# Patient Record
Sex: Male | Born: 1965 | Race: White | Hispanic: No | Marital: Married | Smoking: Never smoker
Health system: Southern US, Community
[De-identification: ages and names within clinical notes are randomized; demographics above are authoritative.]

---

## 2011-01-06 ENCOUNTER — Inpatient Hospital Stay (INDEPENDENT_AMBULATORY_CARE_PROVIDER_SITE_OTHER)
Admission: RE | Admit: 2011-01-06 | Discharge: 2011-01-06 | Disposition: A | Discharge status: Home / Self Care | Payer: BC Managed Care – PPO | Source: Ambulatory Visit | Attending: Family Medicine | Admitting: Family Medicine

## 2011-01-06 ENCOUNTER — Encounter: Payer: Self-pay | Admitting: Family Medicine

## 2011-01-06 DIAGNOSIS — J069 Acute upper respiratory infection, unspecified: Secondary | ICD-10-CM

## 2011-03-04 NOTE — Progress Notes (Signed)
Summary: fever/TM   Vital Signs:  Patient Profile:   45 Years Old Male CC:      fever/cough Height:     68 inches Weight:      187 pounds O2 Sat:      95 % O2 treatment:    Room Air Temp:     98.6 degrees F oral Pulse rate:   83 / minute Resp:     20 per minute BP sitting:   115 / 78  (left arm) Cuff size:   large  Vitals Entered By: Linton Flemings RN (January 06, 2011 2:57 PM)                  Updated Prior Medication List: No Medications Current Allergies (reviewed today): No known allergies History of Present Illness Chief Complaint: fever/cough History of Present Illness: 45 yo M here for URI symptoms since tuesday evening.  Fairly abrupt onset of chills, body aches, fatigue.  + night sweats.  Has been coughing, + fever up to 101.  [facility] and [facility] to go to urgent care because he had some wheezing.  No chest pain.  + posttussive emesis.  REVIEW OF SYSTEMS Constitutional Symptoms       Complains of fever.     Denies chills, night sweats, weight loss, weight gain, and fatigue.  Eyes       Denies change in vision, eye pain, eye discharge, glasses, contact lenses, and eye surgery. Ear/Nose/Throat/Mouth       Denies hearing loss/aids, change in hearing, ear pain, ear discharge, dizziness, frequent runny nose, frequent nose bleeds, sinus problems, sore throat, hoarseness, and tooth pain or bleeding.  Respiratory       Complains of dry cough, wheezing, and shortness of breath.      Denies productive cough, asthma, bronchitis, and emphysema/COPD.  Cardiovascular       Complains of tires easily with exhertion.      Denies murmurs and chest pain.    Gastrointestinal       Complains of stomach pain and nausea/vomiting.      Denies diarrhea, constipation, blood in bowel movements, and indigestion.      Comments: vomiting w/coughing Genitourniary       Denies painful urination, kidney stones, and loss of urinary control. Neurological  Complains of headaches and weakness.      Denies loss of or changes in sensation, numbness, tngling, tremors, paralysis, seizures, and fainting/blackouts. Musculoskeletal       Denies muscle pain, joint pain, joint stiffness, decreased range of motion, redness, swelling, muscle weakness, and gout.  Skin       Denies bruising, unusual mles/lumps or sores, and hair/skin or nail changes.  Psych       Denies mood changes, temper/anger issues, anxiety/stress, speech problems, depression, and sleep problems. Other Comments: symptoms started Tuesday, [facility], instructed to go to [facility], have missed days out of work   Past History:  Family History: Last updated: 01/06/2011 Family History Diabetes 1st degree relative father- cancer-deceased 3  Social History: Last updated: 01/06/2011 smoke-no alcohol- yes drug use- no  Past Medical History: cyst removed from throat -1976  Past Surgical History: Denies surgical history  Family History: Family History Diabetes 1st degree relative father- cancer-deceased 12  Social History: smoke-no alcohol- yes drug use- no Physical Exam General appearance: well developed, well nourished, no acute distress, appears fatigued - coughing Head: normocephalic, atraumatic Eyes: conjunctivae and lids normal Ears: normal,  no lesions or deformities Nasal: mucosa pink, nonedematous, no septal deviation, turbinates normal Oral/Pharynx: tongue normal, posterior pharynx without erythema or exudate Neck: neck supple,  trachea midline, no masses Chest/Lungs: scant wheeze left middle lung field, few rhonchi that clear with cough throughout. no rales, no respiratory distress Heart: regular rate and  rhythm, no murmur Assessment New Problems: UPPER RESPIRATORY INFECTION, ACUTE (ICD-465.9) FAMILY HISTORY DIABETES 1ST DEGREE RELATIVE (ICD-V18.0)  likely flu-like illness, ? bacterial bronchitis  Plan New Orders: New Patient Level III  [99203] Planning Comments:   Azithromycin x 5 days, tessalon during day and tussionex at bedtime as needed for cough (no driving on the latter).  OTC meds discussed and handout provided as well.   The patient and/or caregiver has been counseled thoroughly with regard to medications prescribed including dosage, schedule, interactions, rationale for use, and possible side effects and they verbalize understanding.  Diagnoses and expected course of recovery discussed and will return if not improved as expected or if the condition worsens. Patient and/or caregiver verbalized understanding.   Patient Instructions: 1)  Your symptoms are most likely due to a flu-like illness caused by a virus or bacterial bronchitis. 2)  Antibiotics are helpful typically in three circumstances with these symptoms: fever, symptoms persisting more than 7 days, or improving then worsening again. 3)  Take full course of the antibiotic until gone. 4)  Drink plenty of fluids and stay hydrated.  5)  Cough medicine - tessalon during day, tussionex at bedtime. 6)  See handout for other over the counter medicines that help with other symptoms.  Orders Added: 1)  New Patient Level III [09811]

## 2011-04-08 ENCOUNTER — Emergency Department
Admission: EM | Admit: 2011-04-08 | Discharge: 2011-04-08 | Disposition: A | Discharge status: Home / Self Care | Payer: BC Managed Care – PPO | Source: Home / Self Care | Attending: Emergency Medicine | Admitting: Emergency Medicine

## 2011-04-08 ENCOUNTER — Encounter: Payer: Self-pay | Admitting: *Deleted

## 2011-04-08 DIAGNOSIS — J329 Chronic sinusitis, unspecified: Secondary | ICD-10-CM

## 2011-04-08 MED ORDER — AZITHROMYCIN 250 MG PO TABS: ORAL_TABLET | ORAL | Status: AC

## 2011-04-08 NOTE — ED Provider Notes (Signed)
History     CSN: [id number]  Arrival date & time 04/08/11  1224   First MD Initiated Contact with Patient 04/08/11 1245      Chief Complaint  Patient presents with  . Nasal Congestion    (Consider location/radiation/quality/duration/timing/severity/associated sxs/prior treatment) HPI Leslie Huynh is a 46 y.o. male who complains of onset of cold symptoms for a few days.  Prior to that he had some flulike symptoms, however is improved and now he is having some worsening facial and sinus pressure. His current symptoms are as follows: No sore throat + cough No pleuritic pain No wheezing +nasal congestion + post-nasal drainage +sinus pain/pressure No chest congestion No itchy/red eyes No earache No hemoptysis No SOB No chills/sweats No fever No nausea No vomiting No abdominal pain No diarrhea No skin rashes No fatigue No myalgias No headache    History reviewed. No pertinent past medical history.  History reviewed. No pertinent past surgical history.  Family History  Problem Relation Age of Onset  . Diabetes Mother   . Cancer Father     bone    History  Substance Use Topics  . Smoking status: Never Smoker   . Smokeless tobacco: Not on file  . Alcohol Use: Yes      Review of Systems  Allergies  Review of patient's allergies indicates no known allergies.  Home Medications   Current Outpatient Rx  Name Route Sig Dispense Refill  . AZITHROMYCIN 250 MG PO TABS  Use as directed 1 each 0    BP 122/80  Pulse 75  Temp(Src) 98.5 F (36.9 C) (Oral)  Resp 14  Ht 5\' 8"  (1.727 m)  Wt 187 lb (84.823 kg)  BMI 28.43 kg/m2  SpO2 100%  Physical Exam  Nursing note and vitals reviewed. Constitutional: He is oriented to person, place, and time. He appears well-developed and well-nourished.  HENT:  Head: Normocephalic and atraumatic.  Right Ear: Tympanic membrane, external ear and ear canal normal.  Left Ear: Tympanic membrane, external ear and ear canal normal.    Nose: Mucosal edema and rhinorrhea present.  Mouth/Throat: Posterior oropharyngeal erythema present. No oropharyngeal exudate or posterior oropharyngeal edema.  Eyes: No scleral icterus.  Neck: Neck supple.  Cardiovascular: Regular rhythm and normal heart sounds.   Pulmonary/Chest: Effort normal and breath sounds normal. No respiratory distress.  Neurological: He is alert and oriented to person, place, and time.  Skin: Skin is warm and dry.  Psychiatric: He has a normal mood and affect. His speech is normal.    ED Course  Procedures (including critical care time)  Labs Reviewed - No data to display No results found.   1. Sinusitis       MDM  1)  Take the prescribed antibiotic as instructed. 2)  Use nasal saline solution (over the counter) at least 3 times a day. 3)  Use over the counter decongestants like Zyrtec-D every 12 hours as needed to help with congestion.  If you have hypertension, do not take medicines with sudafed.  4)  Can take tylenol every 6 hours or motrin every 8 hours for pain or fever. 5)  Follow up with your primary doctor if no improvement in 5-7 days, sooner if increasing pain, fever, or new symptoms.     Lily Kocher, MD 04/08/11 1246

## 2011-04-08 NOTE — ED Notes (Signed)
Patient c/o congestion, cough and sore throat. Patient had a flu shot 3 months ago.

## 2013-08-08 ENCOUNTER — Encounter: Payer: Self-pay | Admitting: Emergency Medicine

## 2013-08-08 ENCOUNTER — Emergency Department
Admission: EM | Admit: 2013-08-08 | Discharge: 2013-08-08 | Disposition: A | Discharge status: Home / Self Care | Payer: BC Managed Care – PPO | Source: Home / Self Care | Attending: Family Medicine | Admitting: Family Medicine

## 2013-08-08 DIAGNOSIS — B029 Zoster without complications: Secondary | ICD-10-CM

## 2013-08-08 MED ORDER — VALACYCLOVIR HCL 1 G PO TABS: 1000.0000 mg | ORAL_TABLET | Freq: Three times a day (TID) | ORAL | Status: DC

## 2013-08-08 MED ORDER — DOXYCYCLINE MONOHYDRATE 100 MG PO CAPS: 100.0000 mg | ORAL_CAPSULE | Freq: Two times a day (BID) | ORAL | Status: DC

## 2013-08-08 NOTE — Discharge Instructions (Signed)
Shingles Shingles (herpes zoster) is an infection that is caused by the same virus that causes chickenpox (varicella). The infection causes a painful skin rash and fluid-filled blisters, which eventually break open, crust over, and heal. It may occur in any area of the body, but it usually affects only one side of the body or face. The pain of shingles usually lasts about 1 month. However, some people with shingles may develop long-term (chronic) pain in the affected area of the body. Shingles often occurs many years after the person had chickenpox. It is more common:  In people older than 50 years.  In people with weakened immune systems, such as those with HIV, AIDS, or cancer.  In people taking medicines that weaken the immune system, such as transplant medicines.  In people under great stress. CAUSES  Shingles is caused by the varicella zoster virus (VZV), which also causes chickenpox. After a person is infected with the virus, it can remain in the person's body for years in an inactive state (dormant). To cause shingles, the virus reactivates and breaks out as an infection in a nerve root. The virus can be spread from person to person (contagious) through contact with open blisters of the shingles rash. It will only spread to people who have not had chickenpox. When these people are exposed to the virus, they may develop chickenpox. They will not develop shingles. Once the blisters scab over, the person is no longer contagious and cannot spread the virus to others. SYMPTOMS  Shingles shows up in stages. The initial symptoms may be pain, itching, and tingling in an area of the skin. This pain is usually described as burning, stabbing, or throbbing.In a few days or weeks, a painful red rash will appear in the area where the pain, itching, and tingling were felt. The rash is usually on one side of the body in a band or belt-like pattern. Then, the rash usually turns into fluid-filled blisters. They  will scab over and dry up in approximately 2 3 weeks. Flu-like symptoms may also occur with the initial symptoms, the rash, or the blisters. These may include:  Fever.  Chills.  Headache.  Upset stomach. DIAGNOSIS  Your caregiver will perform a skin exam to diagnose shingles. Skin scrapings or fluid samples may also be taken from the blisters. This sample will be examined under a microscope or sent to a lab for further testing. TREATMENT  There is no specific cure for shingles. Your caregiver will likely prescribe medicines to help you manage the pain, recover faster, and avoid long-term problems. This may include antiviral drugs, anti-inflammatory drugs, and pain medicines. HOME CARE INSTRUCTIONS   Take a cool bath or apply cool compresses to the area of the rash or blisters as directed. This may help with the pain and itching.   Only take over-the-counter or prescription medicines as directed by your caregiver.   Rest as directed by your caregiver.  Keep your rash and blisters clean with mild soap and cool water or as directed by your caregiver.  Do not pick your blisters or scratch your rash. Apply an anti-itch cream or numbing creams to the affected area as directed by your caregiver.  Keep your shingles rash covered with a loose bandage (dressing).  Avoid skin contact with:  Babies.   Pregnant women.   Children with eczema.   Elderly people with transplants.   People with chronic illnesses, such as leukemia or AIDS.   Wear loose-fitting clothing to help ease   the pain of material rubbing against the rash.  Keep all follow-up appointments with your caregiver.If the area involved is on your face, you may receive a referral for follow-up to a specialist, such as an eye doctor (ophthalmologist) or an ear, nose, and throat (ENT) doctor. Keeping all follow-up appointments will help you avoid eye complications, chronic pain, or disability.  SEEK IMMEDIATE MEDICAL  CARE IF:   You have facial pain, pain around the eye area, or loss of feeling on one side of your face.  You have ear pain or ringing in your ear.  You have loss of taste.  Your pain is not relieved with prescribed medicines.   Your redness or swelling spreads.   You have more pain and swelling.  Your condition is worsening or has changed.   You have a feveror persistent symptoms for more than 2 3 days.  You have a fever and your symptoms suddenly get worse. MAKE SURE YOU:  Understand these instructions.  Will watch your condition.  Will get help right away if you are not doing well or get worse. Document Released: 03/18/2005 Document Revised: 12/11/2011 Document Reviewed: 10/31/2011 [employer] Patient Information 2014 [employer].  

## 2013-08-08 NOTE — ED Notes (Signed)
Leslie Huynh noticed 2 bumps on his head 3 days ago. The area became red 1 day ago. Denies itching, pain, fever, chills or sweats.

## 2013-08-08 NOTE — ED Provider Notes (Signed)
CSN: [id number]     Arrival date & time 08/08/13  1708 History   First MD Initiated Contact with Patient 08/08/13 1725     Chief Complaint  Patient presents with  . Skin Problem      HPI Comments: Patient noticed 2 small bumps on his left forehead/scalp 3 days ago.  Yesterday the area became red.  The is no pain but the area "tingles."  He recalls no injury or insect bite.  No fevers, chills, and sweats.   He feels well otherwise.  Patient is a 48 y.o. male presenting with rash. The history is provided by the patient.  Rash Pain location: left scalp. Pain quality comment:  Tingling Pain radiates to:  Does not radiate Pain severity:  Mild Onset quality:  Gradual Duration:  3 days Timing:  Constant Progression:  Worsening Chronicity:  New Relieved by:  Nothing Worsened by:  Nothing tried Associated symptoms: no anorexia, no chills, no cough, no fatigue, no fever, no nausea and no sore throat     History reviewed. No pertinent past medical history. History reviewed. No pertinent past surgical history. Family History  Problem Relation Age of Onset  . Diabetes Mother   . Cancer Father     bone  . Cancer Other     Leukemia   History  Substance Use Topics  . Smoking status: Never Smoker   . Smokeless tobacco: Never Used  . Alcohol Use: Yes    Review of Systems  Constitutional: Negative for fever, chills and fatigue.  HENT: Negative for sore throat.   Respiratory: Negative for cough.   Gastrointestinal: Negative for nausea and anorexia.  Skin: Positive for rash.  All other systems reviewed and are negative.   Allergies  Review of patient's allergies indicates no known allergies.  Home Medications   Prior to Admission medications   Not on File   BP 127/78  Pulse 67  Temp(Src) 98.2 F (36.8 C) (Oral)  Resp 16  Ht 5\' 8"  (1.727 m)  Wt 193 lb (87.544 kg)  BMI 29.35 kg/m2  SpO2 96% Physical Exam  Nursing note and vitals reviewed. Constitutional: He is oriented  to person, place, and time. He appears well-developed and well-nourished. No distress.  HENT:  Head: Normocephalic.    Right Ear: External ear normal.  Left Ear: External ear normal.  Nose: Nose normal.  Mouth/Throat: Oropharynx is clear and moist.  Left scalp and forehead have an area of macular erythema  as noted on diagram.  There are several small 2 to 3mm excoriations.  No tenderness, swelling, or induration.   There is a tender post-auricular node  as noted on diagram.    Eyes: Conjunctivae and EOM are normal. Pupils are equal, round, and reactive to light.  Neck: Neck supple.  Cardiovascular: Normal heart sounds.   Pulmonary/Chest: Breath sounds normal.  Abdominal: There is no tenderness.  Lymphadenopathy:    He has no cervical adenopathy.  Neurological: He is alert and oriented to person, place, and time.  Skin: Skin is warm and dry.    ED Course  Procedures  none      MDM   1. Herpes zoster    Begin Valtrex. Will empirically begin doxycycline to cover possibility of cellulitis. Followup with dermatologist if not improving 3 to 4 days.    Lattie HawStephen A Murice Barbar, MD 08/08/13 [phone]

## 2014-02-28 ENCOUNTER — Emergency Department
Admission: EM | Admit: 2014-02-28 | Discharge: 2014-02-28 | Disposition: A | Discharge status: Home / Self Care | Payer: BC Managed Care – PPO | Source: Home / Self Care | Attending: Family Medicine | Admitting: Family Medicine

## 2014-02-28 ENCOUNTER — Encounter: Payer: Self-pay | Admitting: Emergency Medicine

## 2014-02-28 ENCOUNTER — Encounter (HOSPITAL_BASED_OUTPATIENT_CLINIC_OR_DEPARTMENT_OTHER): Payer: Self-pay | Admitting: *Deleted

## 2014-02-28 ENCOUNTER — Emergency Department (HOSPITAL_BASED_OUTPATIENT_CLINIC_OR_DEPARTMENT_OTHER): Payer: BC Managed Care – PPO

## 2014-02-28 ENCOUNTER — Emergency Department (HOSPITAL_BASED_OUTPATIENT_CLINIC_OR_DEPARTMENT_OTHER)
Admission: EM | Admit: 2014-02-28 | Discharge: 2014-02-28 | Disposition: A | Discharge status: Home / Self Care | Payer: BC Managed Care – PPO | Attending: Emergency Medicine | Admitting: Emergency Medicine

## 2014-02-28 DIAGNOSIS — R079 Chest pain, unspecified: Secondary | ICD-10-CM | POA: Insufficient documentation

## 2014-02-28 DIAGNOSIS — R002 Palpitations: Secondary | ICD-10-CM | POA: Diagnosis not present

## 2014-02-28 DIAGNOSIS — R419 Unspecified symptoms and signs involving cognitive functions and awareness: Secondary | ICD-10-CM | POA: Insufficient documentation

## 2014-02-28 DIAGNOSIS — Z792 Long term (current) use of antibiotics: Secondary | ICD-10-CM | POA: Diagnosis not present

## 2014-02-28 DIAGNOSIS — M940 Chondrocostal junction syndrome [Tietze]: Secondary | ICD-10-CM

## 2014-02-28 LAB — COMPREHENSIVE METABOLIC PANEL
ALT: 55 U/L — AB (ref 0–53)
AST: 41 U/L — AB (ref 0–37)
Albumin: 4.5 g/dL (ref 3.5–5.2)
Alkaline Phosphatase: 100 U/L (ref 39–117)
Anion gap: 15 (ref 5–15)
BUN: 14 mg/dL (ref 6–23)
CALCIUM: 10.2 mg/dL (ref 8.4–10.5)
CHLORIDE: 101 meq/L (ref 96–112)
CO2: 27 meq/L (ref 19–32)
Creatinine, Ser: 1.3 mg/dL (ref 0.50–1.35)
GFR calc Af Amer: 73 mL/min — ABNORMAL LOW (ref >90)
GFR, EST NON AFRICAN AMERICAN: 63 mL/min — AB (ref >90)
Glucose, Bld: 96 mg/dL (ref 70–99)
Potassium: 4 mEq/L (ref 3.7–5.3)
SODIUM: 143 meq/L (ref 137–147)
Total Bilirubin: 0.3 mg/dL (ref 0.3–1.2)
Total Protein: 7.6 g/dL (ref 6.0–8.3)

## 2014-02-28 LAB — CBC WITH DIFFERENTIAL/PLATELET
BASOS ABS: 0 10*3/uL (ref 0.0–0.1)
Basophils Relative: 0 % (ref 0–1)
EOS PCT: 1 % (ref 0–5)
Eosinophils Absolute: 0.1 10*3/uL (ref 0.0–0.7)
HCT: 41.9 % (ref 39.0–52.0)
Hemoglobin: 14.6 g/dL (ref 13.0–17.0)
LYMPHS PCT: 20 % (ref 12–46)
Lymphs Abs: 2.1 10*3/uL (ref 0.7–4.0)
MCH: 32.2 pg (ref 26.0–34.0)
MCHC: 34.8 g/dL (ref 30.0–36.0)
MCV: 92.5 fL (ref 78.0–100.0)
Monocytes Absolute: 1 10*3/uL (ref 0.1–1.0)
Monocytes Relative: 10 % (ref 3–12)
NEUTROS ABS: 7.3 10*3/uL (ref 1.7–7.7)
NEUTROS PCT: 69 % (ref 43–77)
PLATELETS: 196 10*3/uL (ref 150–400)
RBC: 4.53 MIL/uL (ref 4.22–5.81)
RDW: 12.6 % (ref 11.5–15.5)
WBC: 10.5 10*3/uL (ref 4.0–10.5)

## 2014-02-28 LAB — CK TOTAL AND CKMB (NOT AT ARMC)
CK TOTAL: 383 U/L — AB (ref 7–232)
CK, MB: 5.5 ng/mL — AB (ref 0.0–5.0)
RELATIVE INDEX: 1.4 (ref 0.0–4.0)

## 2014-02-28 LAB — TROPONIN I: Troponin I: <0.3 ng/mL (ref <0.30)

## 2014-02-28 MED ORDER — MELOXICAM 15 MG PO TABS: 15.0000 mg | ORAL_TABLET | Freq: Every day | ORAL | Status: DC

## 2014-02-28 MED ORDER — ASPIRIN 325 MG PO TABS
325.0000 mg | ORAL_TABLET | Freq: Once | ORAL | Status: AC
  Administered 2014-02-28: 325 mg via ORAL
  Filled 2014-02-28: qty 1

## 2014-02-28 NOTE — Discharge Instructions (Signed)

## 2014-02-28 NOTE — ED Notes (Signed)
Chest pain started Saturday and Sunday intermittently, today constant dull pain 3/10, no history, denies nausea, pain down arm, also ran 4k this morning

## 2014-02-28 NOTE — ED Provider Notes (Signed)
CSN: [id number]     Arrival date & time 02/28/14  1639 History  This chart was scribed for Leslie Huynh [name], MD by Gwenyth Oberatherine Macek, ED Scribe. This patient was seen in room MH10/MH10 and the patient's care was started at 5:01 PM.    Chief Complaint  Patient presents with  . Chest Pain   The history is provided by the patient. No language interpreter was used.    HPI Comments: Leslie Huynh is a 48 y.o. male who presents to the Emergency Department complaining of intermittent, mild fluttering in his chest that started 2 days ago. Pt [facility] for the same symptoms where he had elevated CK and CKMB levels. He states that his symptoms decreased after then visit, but then returned when he received a call from Dr. Cathren HarshBeese to go to the ER. Pt notes that he ran this morning and that symptoms reoccurred after exercise, however were improved during exercising. 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 He can feel fluttering intermittently with deep breaths and notes no change when lying flat. He has not taken Aspirin today. Pt denies a history of similar symptoms, a history of medical problems and a family history of CAD less than 49 y/o. He also denies nausea, vomiting, dark urine and dysuria as associated symptoms.   History reviewed. No pertinent past medical history. History reviewed. No pertinent past surgical history. Family History  Problem Relation Age of Onset  . Diabetes Mother   . Cancer Father     bone  . Cancer Other     Leukemia   History  Substance Use Topics  . Smoking status: Never Smoker   . Smokeless tobacco: Never Used  . Alcohol Use: Yes    Review of Systems  Cardiovascular: Positive for palpitations.       Fluttering  Gastrointestinal: Negative for nausea and vomiting.  Genitourinary: Negative for dysuria.  All other systems reviewed and are negative.  Allergies  Review of patient's allergies indicates  no known allergies.  Home Medications   Prior to Admission medications   Medication Sig Start Date End Date Taking? Authorizing Provider  meloxicam (MOBIC) 15 MG tablet Take 1 tablet (15 mg total) by mouth daily. Take with food each morning 02/28/14   Lattie HawStephen A Beese, MD  valACYclovir (VALTREX) 1000 MG tablet Take 1 tablet (1,000 mg total) by mouth 3 (three) times daily. 08/08/13   Lattie HawStephen A Beese, MD   BP 142/83 mmHg  Pulse 70  Temp(Src) 97.6 F (36.4 C) (Oral)  Resp 18  SpO2 100% Physical Exam  Constitutional: He is oriented to person, place, and time. He appears well-developed and well-nourished.  HENT:  Head: Normocephalic and atraumatic.  Eyes: Conjunctivae and EOM are normal.  Neck: Normal range of motion. Neck supple.  Cardiovascular: Normal rate, regular rhythm and normal heart sounds.   Pulmonary/Chest: Effort normal and breath sounds normal. No respiratory distress.  Abdominal: He exhibits no distension. There is no tenderness. There is no rebound and no guarding.  Musculoskeletal: Normal range of motion.  Neurological: He is alert and oriented to person, place, and time.  Skin: Skin is warm and dry.  Vitals reviewed.   ED Course  Procedures (including critical care time) DIAGNOSTIC STUDIES: Oxygen Saturation is 100% on RA, normal by my interpretation.    COORDINATION OF CARE: 5:10 PM Discussed treatment plan with pt which includes lab work. Pt agreed to plan.  Labs Review  Labs Reviewed  COMPREHENSIVE METABOLIC PANEL - Abnormal; Notable for the following:    AST 41 (*)    ALT 55 (*)    GFR calc non Af Amer 63 (*)    GFR calc Af Amer 73 (*)    All other components within normal limits  CBC WITH DIFFERENTIAL  TROPONIN I    Imaging Review Dg Chest Portable 1 View  02/28/2014   CLINICAL DATA:  48 year old male with intermittent left-sided chest pain  EXAM: PORTABLE CHEST - 1 VIEW  COMPARISON:  None.  FINDINGS: The lungs are clear and negative for focal  airspace consolidation, pulmonary edema or suspicious pulmonary nodule. No pleural effusion or pneumothorax. Cardiac and mediastinal contours are within normal limits. No acute fracture or lytic or blastic osseous lesions. The visualized upper abdominal bowel gas pattern is unremarkable.  IMPRESSION: Negative chest x-ray.   Electronically Signed   By: Malachy MoanHeath  McCullough M.D.   On: 02/28/2014 17:20     EKG Interpretation None      MDM   Final diagnoses:  Chest pain    48 y.o. male without pertinent PMH presents with chest fluttering beginning 2 days ago. He was seen by urgent care today who obtained a CK and CK-MB which were mildly elevated. The patient's symptoms are extremely atypical for ACS and seem entirely isolated to anxiety. EKG without acute ST or T-wave changes. On arrival the patient's vital signs and physical exam as above. He has had no acute change in symptoms over the last 6 hours. Troponin negative. Unlikely ACS given constellation of symptoms. Elevation in CK likely secondary to exercise.  Discharged home in stable condition with standard return precautions for chest pain. Cardiology referral given.    1. Chest pain           Leslie Huynh Carliyah Cotterman, MD 03/01/14 [phone]

## 2014-02-28 NOTE — ED Provider Notes (Signed)
CSN: [id number]     Arrival date & time 02/28/14  1143 History   None    Chief Complaint  Patient presents with  . Chest Pain      HPI Comments: Patient reports that two days ago after lunch he felt a vague sensation in his anterior chest like indigestion and it lasted several hours.  He denies shortness of breath, radiation, nausea, sweating.  Yesterday he noted brief flashes of discomfort in his anterior chest that lasted all day.  He recalls no recent injury or change in activities although he notes that he regularly lifts 40 to 50 pounds at work.  Today he ran 4k, and notes that his chest discomfort has been constant for about 3 hours.  He tried taking an antacid, but he is not sure if it helped. Patient has no past history of cardiac disease and no family history of cardiac disease.  Patient is a 48 y.o. male presenting with chest pain. The history is provided by the patient.  Chest Pain Pain location:  L chest Pain quality: dull   Pain quality: not crushing, no pressure, not radiating, not stabbing and not throbbing   Pain radiates to:  Does not radiate Pain radiates to the back: no   Pain severity:  Mild Onset quality:  Gradual Duration:  2 days Timing:  Intermittent Progression:  Waxing and waning Chronicity:  New Context: stress   Context: not breathing, not eating, not lifting, no movement, not raising an arm, not at rest and no trauma   Relieved by:  Nothing Worsened by:  Nothing tried Ineffective treatments:  Antacids Associated symptoms: anxiety and fatigue   Associated symptoms: no abdominal pain, no AICD problem, no back pain, no cough, no diaphoresis, no dizziness, no dysphagia, no fever, no headache, no heartburn, no lower extremity edema, no nausea, no near-syncope, no numbness, no palpitations, no shortness of breath and not vomiting   Risk factors: male sex     History reviewed. No pertinent past medical history. History reviewed. No pertinent past surgical  history. Family History  Problem Relation Age of Onset  . Diabetes Mother   . Cancer Father     bone  . Cancer Other     Leukemia   History  Substance Use Topics  . Smoking status: Never Smoker   . Smokeless tobacco: Never Used  . Alcohol Use: Yes    Review of Systems  Constitutional: Positive for fatigue. Negative for fever and diaphoresis.  HENT: Negative for trouble swallowing.   Respiratory: Negative for cough and shortness of breath.   Cardiovascular: Positive for chest pain. Negative for palpitations and near-syncope.  Gastrointestinal: Negative for heartburn, nausea, vomiting and abdominal pain.  Musculoskeletal: Negative for back pain.  Neurological: Negative for dizziness, numbness and headaches.    Allergies  Review of patient's allergies indicates not on file.  Home Medications   Prior to Admission medications   Medication Sig Start Date End Date Taking? Authorizing Provider  meloxicam (MOBIC) 15 MG tablet Take 1 tablet (15 mg total) by mouth daily. Take with food each morning 02/28/14   Lattie HawStephen A Beese, MD  valACYclovir (VALTREX) 1000 MG tablet Take 1 tablet (1,000 mg total) by mouth 3 (three) times daily. 08/08/13   Lattie HawStephen A Beese, MD   BP 126/85 mmHg  Pulse 60  Temp(Src) 97.7 F (36.5 C) (Oral)  Ht 5\' 8"  (1.727 m)  Wt 192 lb (87.091 kg)  BMI 29.20 kg/m2  SpO2 97% Physical Exam  Constitutional: He is oriented to person, place, and time. He appears well-developed and well-nourished. No distress.  HENT:  Head: Normocephalic.  Mouth/Throat: Oropharynx is clear and moist.  Eyes: Conjunctivae are normal. Pupils are equal, round, and reactive to light.  Neck: Normal range of motion. Neck supple.  Cardiovascular: Normal rate, regular rhythm, normal heart sounds and intact distal pulses.   Pulmonary/Chest: Effort normal and breath sounds normal. No respiratory distress. He has no wheezes. He has no rales. He exhibits tenderness.    Left anterior chest has  distinct tenderness beginning at sternum and extending laterally as noted on diagram.  Palpation there recreates his discomfort.  Abdominal: Soft. There is no tenderness.  Musculoskeletal: He exhibits no edema.  Lymphadenopathy:    He has no cervical adenopathy.  Neurological: He is alert and oriented to person, place, and time.  Skin: Skin is warm and dry. No rash noted. He is not diaphoretic.  Nursing note and vitals reviewed.   ED Course  Procedures  None    Labs Reviewed  CK TOTAL AND CKMB   EKG: Rate:  62 BPM PR:  198 msec QT:  392 msec QTcH:  395 msec QRSD:  118 msec QRS axis:  -26 degrees Interpretation:   Normal sinus rhythm.  Non specific changes.  No acute ST or T-wave changes    MDM   1. Costochondritis    Risk stratification with stat CKMB pending. Rx for Mobic Apply ice pack for 20 to 30 minutes, 3 to 4 times daily  Continue until pain decreases.  Avoid heavy lifting. If symptoms become significantly worse during the night or over the weekend, proceed to the local emergency room.  Followup with Family Doctor in about 4 days.   Later Addendum: Total CK elevated 383 U/L (normal 7 - 232) CK, MB mildly elevated 5.5 ng/mL (normal 0.0 - 5.0) Discussed with patient; advised to proceed to ER for further evaluation.    Lattie HawStephen A Beese, MD 03/03/14 [phone]

## 2014-02-28 NOTE — ED Notes (Signed)
[facility].  Continued to have it today, pt was weak and not feeling right this morning after running.  [facility]

## 2014-02-28 NOTE — Discharge Instructions (Signed)
Apply ice pack for 20 to 30 minutes, 3 to 4 times daily  Continue until pain decreases.  Avoid heavy lifting. If symptoms become significantly worse during the night or over the weekend, proceed to the local emergency room.    Costochondritis Costochondritis, sometimes called Tietze syndrome, is a swelling and irritation (inflammation) of the tissue (cartilage) that connects your ribs with your breastbone (sternum). It causes pain in the chest and rib area. Costochondritis usually goes away on its own over time. It can take up to 6 weeks or longer to get better, especially if you are unable to limit your activities. CAUSES  Some cases of costochondritis have no known cause. Possible causes include:  Injury (trauma).  Exercise or activity such as lifting.  Severe coughing. SIGNS AND SYMPTOMS  Pain and tenderness in the chest and rib area.  Pain that gets worse when coughing or taking deep breaths.  Pain that gets worse with specific movements. DIAGNOSIS  Your health care provider will do a physical exam and ask about your symptoms. Chest X-rays or other tests may be done to rule out other problems. TREATMENT  Costochondritis usually goes away on its own over time. Your health care provider may prescribe medicine to help relieve pain. HOME CARE INSTRUCTIONS   Avoid exhausting physical activity. Try not to strain your ribs during normal activity. This would include any activities using chest, abdominal, and side muscles, especially if heavy weights are used.  Apply ice to the affected area for the first 2 days after the pain begins.  Put ice in a plastic bag.  Place a towel between your skin and the bag.  Leave the ice on for 20 minutes, 2-3 times a day.  Only take over-the-counter or prescription medicines as directed by your health care provider. SEEK MEDICAL CARE IF:  You have redness or swelling at the rib joints. These are signs of infection.  Your pain does not go away  despite rest or medicine. SEEK IMMEDIATE MEDICAL CARE IF:   Your pain increases or you are very uncomfortable.  You have shortness of breath or difficulty breathing.  You cough up blood.  You have worse chest pains, sweating, or vomiting.  You have a fever or persistent symptoms for more than 2-3 days.  You have a fever and your symptoms suddenly get worse. MAKE SURE YOU:   Understand these instructions.  Will watch your condition.  Will get help right away if you are not doing well or get worse. Document Released: 12/26/2004 Document Revised: 01/06/2013 Document Reviewed: 10/20/2012 [facility] Patient Information 2015 [place]. This information is not intended to replace advice given to you by your health care provider. Make sure you discuss any questions you have with your health care provider.

## 2014-12-12 ENCOUNTER — Emergency Department (INDEPENDENT_AMBULATORY_CARE_PROVIDER_SITE_OTHER): Payer: BC Managed Care – PPO

## 2014-12-12 ENCOUNTER — Emergency Department
Admission: EM | Admit: 2014-12-12 | Discharge: 2014-12-12 | Disposition: A | Discharge status: Home / Self Care | Payer: BC Managed Care – PPO | Source: Home / Self Care | Attending: Family Medicine | Admitting: Family Medicine

## 2014-12-12 ENCOUNTER — Encounter: Payer: Self-pay | Admitting: *Deleted

## 2014-12-12 DIAGNOSIS — J00 Acute nasopharyngitis [common cold]: Secondary | ICD-10-CM | POA: Diagnosis not present

## 2014-12-12 DIAGNOSIS — J209 Acute bronchitis, unspecified: Secondary | ICD-10-CM | POA: Diagnosis not present

## 2014-12-12 MED ORDER — AZITHROMYCIN 250 MG PO TABS: ORAL_TABLET | ORAL | Status: AC

## 2014-12-12 MED ORDER — PREDNISONE 20 MG PO TABS: 20.0000 mg | ORAL_TABLET | Freq: Two times a day (BID) | ORAL | Status: AC

## 2014-12-12 MED ORDER — GUAIFENESIN-CODEINE 100-10 MG/5ML PO SOLN: ORAL | Status: AC

## 2014-12-12 NOTE — ED Notes (Signed)
Pt c/o 1 month of cough, HA, runny nose, sinus drainage. [facility].

## 2014-12-12 NOTE — ED Provider Notes (Signed)
CSN: [id number]     Arrival date & time 12/12/14  1537 History   First MD Initiated Contact with Patient 12/12/14 1609     Chief Complaint  Patient presents with  . URI      HPI Comments: Patient reports that he developed a mild sore throat 3.5 weeks ago that was immediately followed by nasal congestion.   One week later he visited a [facility] where he was prescribed Augmentin.  During the past week he has developed increasing cough, shortness of breath, and fatigue.  He often coughs until he gags.  His ears feel clogged.  He has been taking Allegra without improvement.  He does not remember his last Tdap.  The history is provided by the patient.    History reviewed. No pertinent past medical history. History reviewed. No pertinent past surgical history. Family History  Problem Relation Age of Onset  . Diabetes Mother   . Cancer Father     bone  . Cancer Other     Leukemia   Social History  Substance Use Topics  . Smoking status: Never Smoker   . Smokeless tobacco: Never Used  . Alcohol Use: Yes    Review of Systems + sore throat, resolved + cough No pleuritic pain + wheezing + nasal congestion + post-nasal drainage No sinus pain/pressure No itchy/red eyes ? earache No hemoptysis + SOB with activity No fever/chills No nausea No vomiting No abdominal pain No diarrhea No urinary symptoms No skin rash + fatigue No myalgias + headache Used OTC meds without relief  Allergies  Review of patient's allergies indicates no known allergies.  Home Medications   Prior to Admission medications   Medication Sig Start Date End Date Taking? Authorizing Provider  Fexofenadine HCl (ALLEGRA PO) Take by mouth.   Yes Historical Provider, MD  azithromycin (ZITHROMAX Z-PAK) 250 MG tablet Take 2 tabs today; then begin one tab once daily for 4 more days. 12/12/14   Lattie Haw, MD  guaiFENesin-codeine 100-10 MG/5ML syrup Take 10mL by mouth at bedtime as needed for cough  12/12/14   Lattie Haw, MD  predniSONE (DELTASONE) 20 MG tablet Take 1 tablet (20 mg total) by mouth 2 (two) times daily. Take with food. 12/12/14   Lattie Haw, MD   Meds Ordered and Administered this Visit  Medications - No data to display  BP 117/76 mmHg  Pulse 75  Temp(Src) 99.1 F (37.3 C) (Oral)  Resp 16  Wt 190 lb (86.183 kg)  SpO2 96% No data found.   Physical Exam Nursing notes and Vital Signs reviewed. Appearance:  Patient appears stated age, and in no acute distress Eyes:  Pupils are equal, round, and reactive to light and accomodation.  Extraocular movement is intact.  Conjunctivae are not inflamed  Ears:  Canals normal.  Tympanic membranes normal.  Nose:  Mildly congested turbinates.  No sinus tenderness.    Pharynx:  Normal Neck:  Supple.  No adenopathy Lungs:   Faint bilateral expiratory wheezes.  Breath sounds are equal.  Moving air well. Heart:  Regular rate and rhythm without murmurs, rubs, or gallops.  Abdomen:  Nontender without masses or hepatosplenomegaly.  Bowel sounds are present.  No CVA or flank tenderness.  Extremities:  No edema.  No calf tenderness Skin:  No rash present.   ED Course  Procedures  None   Imaging Review Dg Chest 2 View  12/12/2014   CLINICAL DATA:  Cold for 3-1/2 weeks. Persistent dry cough,  sinus headaches, shortness of breath.  EXAM: CHEST  2 VIEW  COMPARISON:  Chest x-ray dated 02/28/2014.  FINDINGS: Cardiomediastinal silhouette remains normal in size and configuration. Lungs are clear. No evidence of pneumonia. No pleural effusion. No pneumothorax. Mild degenerative change noted within the mid and upper thoracic spine. No acute osseous abnormality.  IMPRESSION: No evidence of acute cardiopulmonary abnormality.  No pneumonia.   Electronically Signed   By: Bary Richard M.D.   On: 12/12/2014 16:43      MDM   1. Acute bronchitis, unspecified organism; ?pertussis    Begin Z-pak for atypical coverage. Prednisone burst. Rx for  Robitussin AC for night time cough.  Take plain guaifenesin (  extended release tabs such as Mucinex) twice daily, with plenty of water, for cough and congestion.  May add Pseudoephedrine ( , one or two every 4 to 6 hours) for sinus congestion.  Get adequate rest.    Stop all antihistamines for now, and other non-prescription cough/cold preparations. Recommend a Tdap when well.  Followup with Family Doctor if not improved in one week.     Lattie Haw, MD 12/18/14 [phone]

## 2014-12-12 NOTE — Discharge Instructions (Signed)
Take plain guaifenesin (  extended release tabs such as Mucinex) twice daily, with plenty of water, for cough and congestion.  May add Pseudoephedrine ( , one or two every 4 to 6 hours) for sinus congestion.  Get adequate rest.    Stop all antihistamines for now, and other non-prescription cough/cold preparations. Recommend a Tdap when well.

## 2015-04-24 IMAGING — CR DG CHEST 1V PORT
1 series · 1 of 1 positions shown · non-contrast
Comparison: None.

CLINICAL DATA: 48-year-old male with intermittent left-sided chest
pain

EXAM:
PORTABLE CHEST - 1 VIEW

[view not recorded]
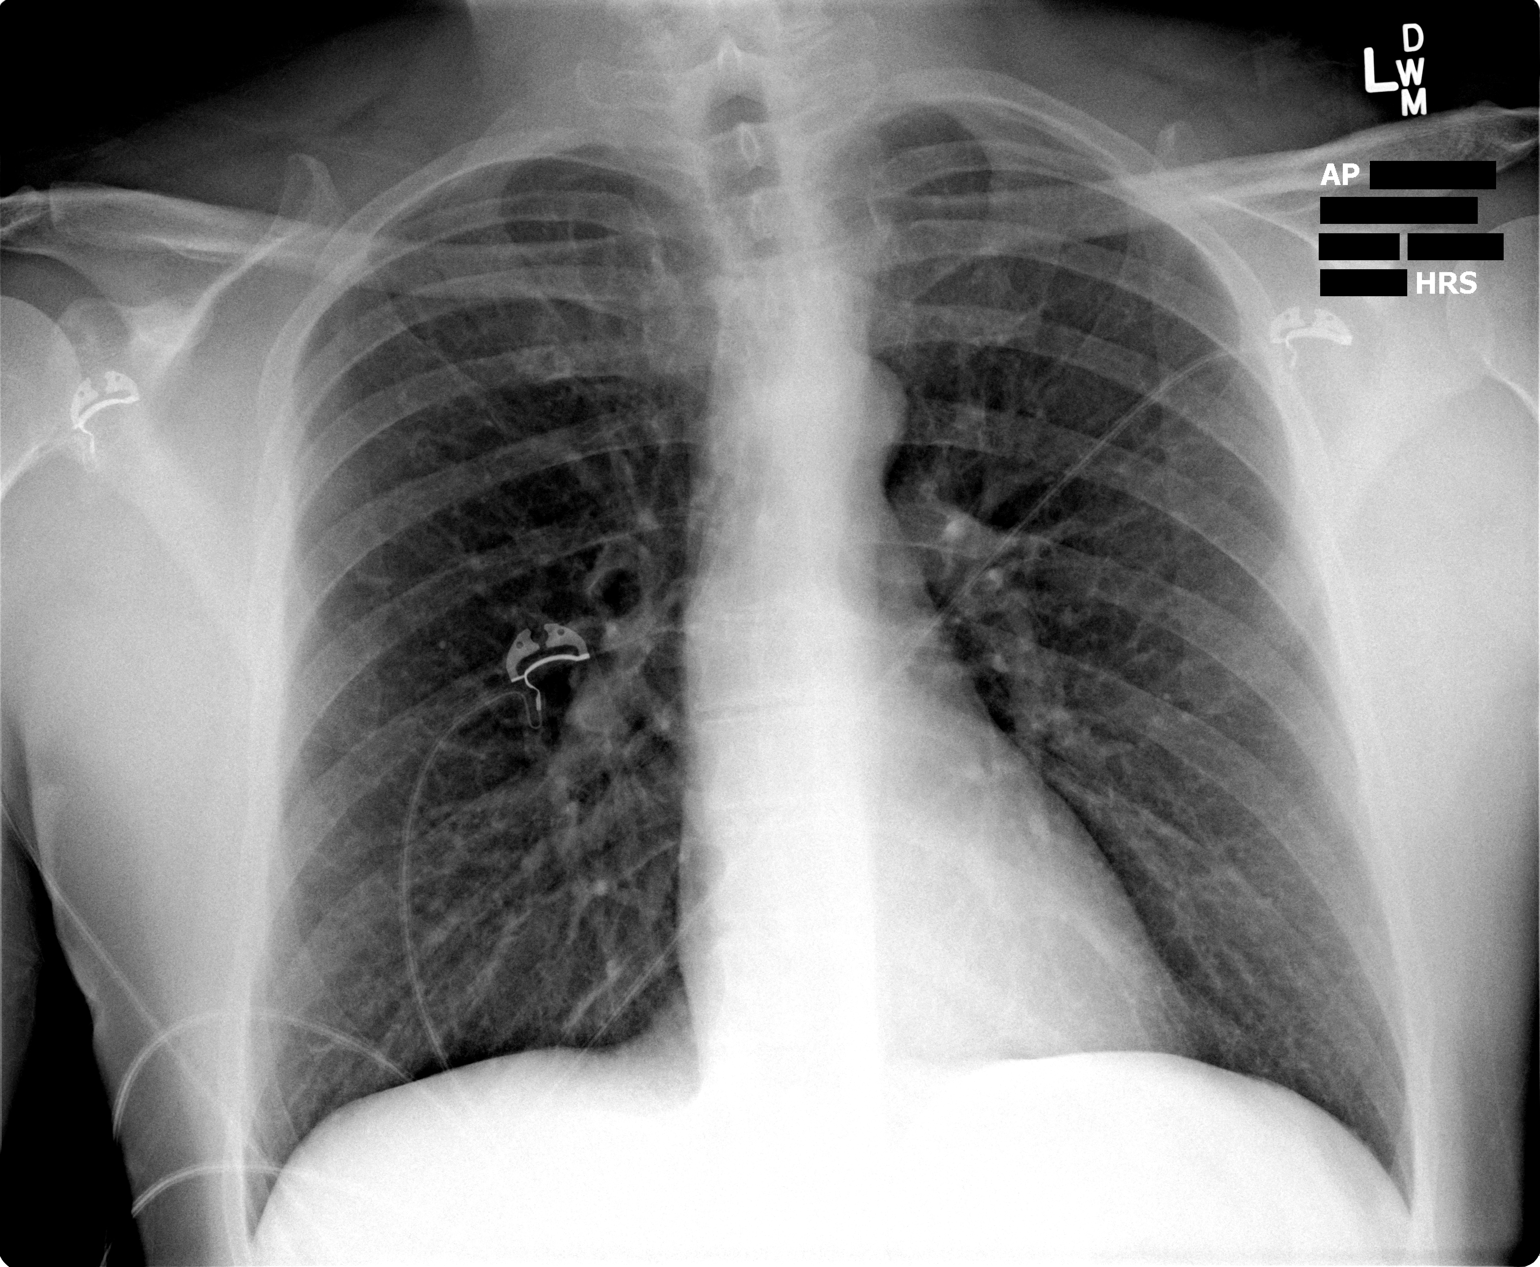

[1 of 1 positions shown; findings below may reference images not displayed]

FINDINGS: The lungs are clear and negative for focal airspace consolidation,
pulmonary edema or suspicious pulmonary nodule. No pleural effusion
or pneumothorax. Cardiac and mediastinal contours are within normal
limits. No acute fracture or lytic or blastic osseous lesions. The
visualized upper abdominal bowel gas pattern is unremarkable.
IMPRESSION: Negative chest x-ray.

## 2016-02-05 IMAGING — CR DG CHEST 2V
2 series · 2 of 2 positions shown · non-contrast
Comparison: Chest x-ray dated 02/28/2014.

CLINICAL DATA: Cold for 3-1/2 weeks. Persistent dry cough, sinus
headaches, shortness of breath.

EXAM:
CHEST  2 VIEW

[chest pa]
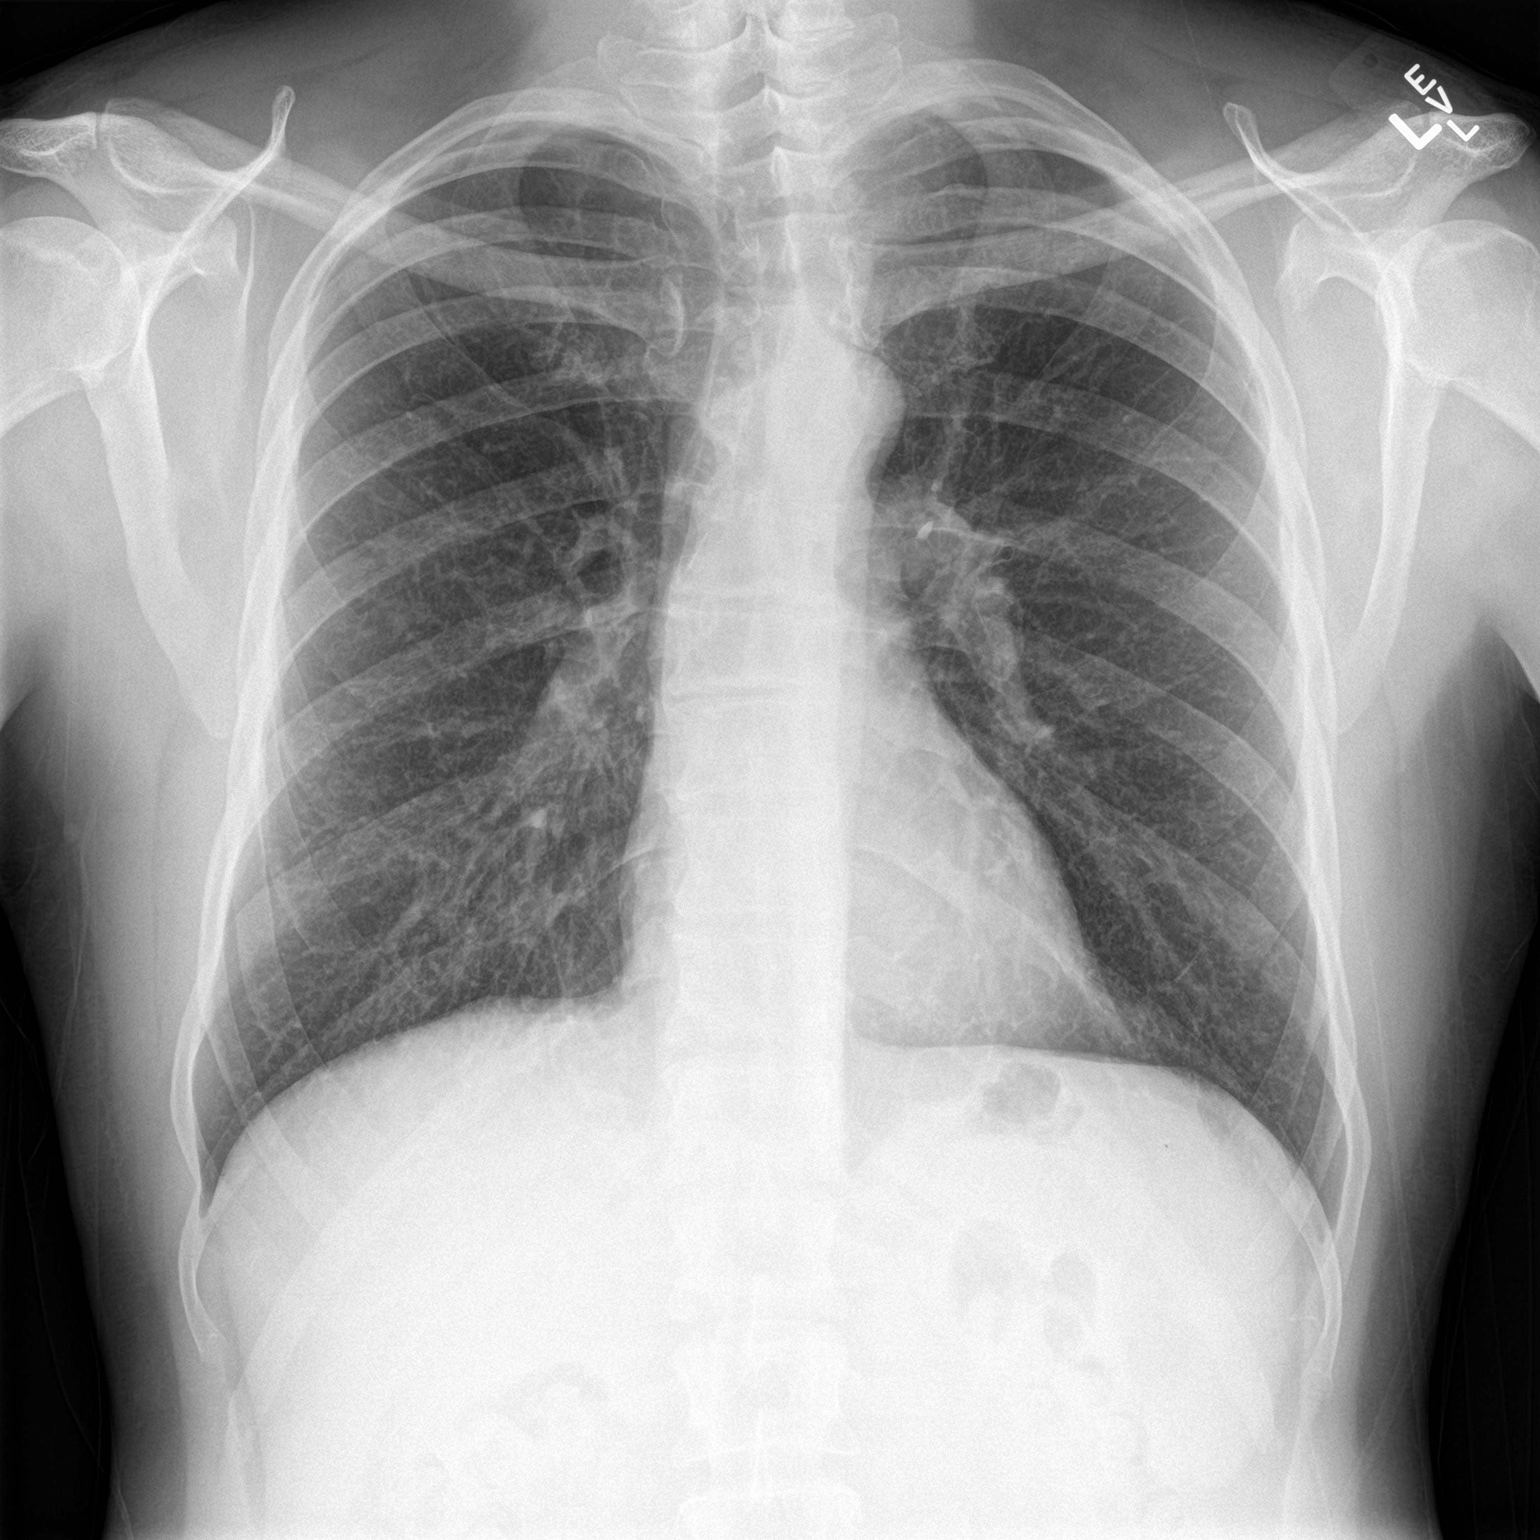

[chest lat]
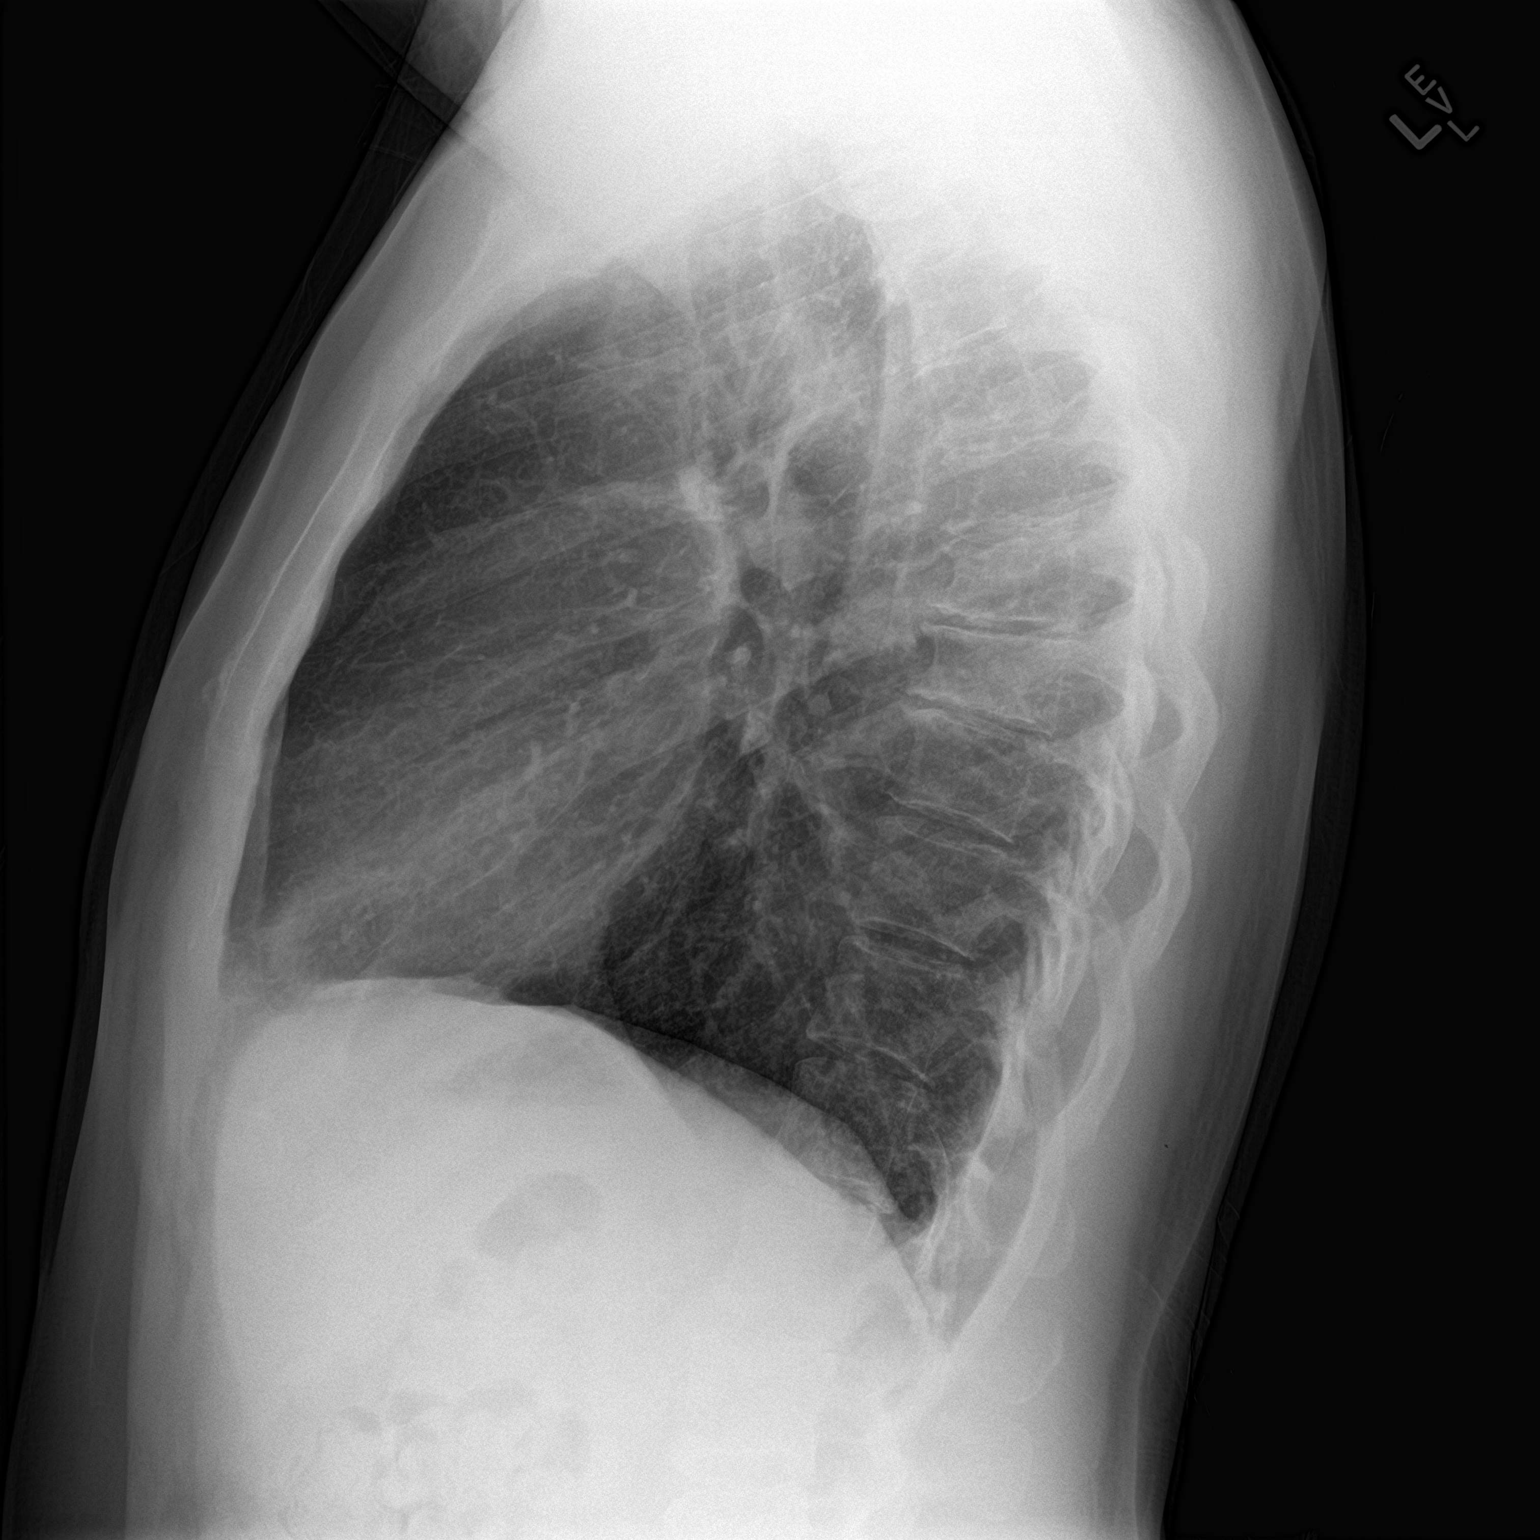

[2 of 2 positions shown; findings below may reference images not displayed]

FINDINGS: Cardiomediastinal silhouette remains normal in size and
configuration. Lungs are clear. No evidence of pneumonia. No pleural
effusion. No pneumothorax. Mild degenerative change noted within the
mid and upper thoracic spine. No acute osseous abnormality.
IMPRESSION: No evidence of acute cardiopulmonary abnormality.  No pneumonia.
